# Patient Record
Sex: Female | Born: 1985 | Race: Black or African American | Hispanic: No | Marital: Single
Health system: Southern US, Community
[De-identification: ages and names within clinical notes are randomized; demographics above are authoritative.]

## PROBLEM LIST (undated history)

## (undated) DIAGNOSIS — N879 Dysplasia of cervix uteri, unspecified: Secondary | ICD-10-CM

## (undated) HISTORY — DX: Dysplasia of cervix uteri, unspecified: N87.9

---

## 2006-06-25 ENCOUNTER — Ambulatory Visit: Payer: Self-pay | Admitting: Family Medicine

## 2006-06-25 ENCOUNTER — Other Ambulatory Visit: Admission: RE | Admit: 2006-06-25 | Discharge: 2006-06-25 | Payer: Self-pay | Admitting: Family Medicine

## 2006-06-25 ENCOUNTER — Encounter (INDEPENDENT_AMBULATORY_CARE_PROVIDER_SITE_OTHER): Payer: Self-pay | Admitting: *Deleted

## 2006-07-02 ENCOUNTER — Ambulatory Visit: Payer: Self-pay | Admitting: Family Medicine

## 2006-11-19 ENCOUNTER — Ambulatory Visit: Payer: Self-pay | Admitting: Family Medicine

## 2007-01-10 ENCOUNTER — Ambulatory Visit: Payer: Self-pay | Admitting: Family Medicine

## 2007-01-10 ENCOUNTER — Other Ambulatory Visit: Admission: RE | Admit: 2007-01-10 | Discharge: 2007-01-10 | Payer: Self-pay | Admitting: Family Medicine

## 2007-01-10 ENCOUNTER — Encounter: Payer: Self-pay | Admitting: Family Medicine

## 2007-01-10 ENCOUNTER — Encounter (INDEPENDENT_AMBULATORY_CARE_PROVIDER_SITE_OTHER): Payer: Self-pay | Admitting: *Deleted

## 2007-01-11 ENCOUNTER — Encounter: Payer: Self-pay | Admitting: Family Medicine

## 2007-01-21 ENCOUNTER — Encounter: Admission: RE | Admit: 2007-01-21 | Discharge: 2007-01-21 | Payer: Self-pay | Admitting: Family Medicine

## 2007-01-21 ENCOUNTER — Ambulatory Visit: Payer: Self-pay | Admitting: Family Medicine

## 2007-03-25 ENCOUNTER — Ambulatory Visit: Payer: Self-pay | Admitting: Family Medicine

## 2007-03-31 ENCOUNTER — Ambulatory Visit: Payer: Self-pay | Admitting: Family Medicine

## 2007-03-31 DIAGNOSIS — B373 Candidiasis of vulva and vagina: Secondary | ICD-10-CM | POA: Insufficient documentation

## 2007-04-01 ENCOUNTER — Encounter: Payer: Self-pay | Admitting: Family Medicine

## 2007-04-04 ENCOUNTER — Telehealth (INDEPENDENT_AMBULATORY_CARE_PROVIDER_SITE_OTHER): Payer: Self-pay | Admitting: *Deleted

## 2007-04-04 LAB — CONVERTED CEMR LAB: GC Probe Amp, Genital: NEGATIVE

## 2007-05-19 ENCOUNTER — Telehealth (INDEPENDENT_AMBULATORY_CARE_PROVIDER_SITE_OTHER): Payer: Self-pay | Admitting: *Deleted

## 2007-05-26 ENCOUNTER — Ambulatory Visit: Payer: Self-pay | Admitting: Family Medicine

## 2007-05-26 DIAGNOSIS — N6459 Other signs and symptoms in breast: Secondary | ICD-10-CM

## 2007-05-26 DIAGNOSIS — R002 Palpitations: Secondary | ICD-10-CM | POA: Insufficient documentation

## 2007-05-27 ENCOUNTER — Ambulatory Visit: Payer: Self-pay | Admitting: Family Medicine

## 2007-06-01 LAB — CONVERTED CEMR LAB
Basophils Relative: 1.5 % — ABNORMAL HIGH (ref 0.0–1.0)
HCT: 35.2 % — ABNORMAL LOW (ref 36.0–46.0)
Hemoglobin: 12 g/dL (ref 12.0–15.0)
Lymphocytes Relative: 52.5 % — ABNORMAL HIGH (ref 12.0–46.0)
MCHC: 34 g/dL (ref 30.0–36.0)
Monocytes Absolute: 0.4 10*3/uL (ref 0.2–0.7)
Monocytes Relative: 7.7 % (ref 3.0–11.0)
Neutro Abs: 2.1 10*3/uL (ref 1.4–7.7)
Neutrophils Relative %: 36.3 % — ABNORMAL LOW (ref 43.0–77.0)
Prolactin: 25.7 ng/mL

## 2007-08-01 ENCOUNTER — Ambulatory Visit: Payer: Self-pay | Admitting: Family Medicine

## 2007-11-10 ENCOUNTER — Encounter: Payer: Self-pay | Admitting: Family Medicine

## 2007-11-10 ENCOUNTER — Other Ambulatory Visit: Admission: RE | Admit: 2007-11-10 | Discharge: 2007-11-10 | Payer: Self-pay | Admitting: Family Medicine

## 2007-11-10 ENCOUNTER — Ambulatory Visit: Payer: Self-pay | Admitting: Family Medicine

## 2007-11-10 LAB — CONVERTED CEMR LAB
Bilirubin Urine: NEGATIVE
Blood in Urine, dipstick: NEGATIVE
Protein, U semiquant: NEGATIVE
Urobilinogen, UA: NEGATIVE
pH: 7.5

## 2007-11-14 LAB — CONVERTED CEMR LAB
AST: 28 units/L (ref 0–37)
Albumin: 4.2 g/dL (ref 3.5–5.2)
Alkaline Phosphatase: 80 units/L (ref 39–117)
BUN: 8 mg/dL (ref 6–23)
Basophils Absolute: 0 10*3/uL (ref 0.0–0.1)
Chloride: 103 meq/L (ref 96–112)
Cholesterol: 171 mg/dL (ref 0–200)
Creatinine, Ser: 0.8 mg/dL (ref 0.4–1.2)
Eosinophils Absolute: 0.1 10*3/uL (ref 0.0–0.6)
GFR calc non Af Amer: 95 mL/min
HCT: 38.1 % (ref 36.0–46.0)
HDL: 63.8 mg/dL (ref >39.0)
LDL Cholesterol: 96 mg/dL (ref 0–99)
MCHC: 34.1 g/dL (ref 30.0–36.0)
MCV: 81.8 fL (ref 78.0–100.0)
Monocytes Relative: 13.7 % — ABNORMAL HIGH (ref 3.0–11.0)
Platelets: 230 10*3/uL (ref 150–400)
RBC: 4.65 M/uL (ref 3.87–5.11)
RDW: 13.5 % (ref 11.5–14.6)
Sodium: 137 meq/L (ref 135–145)
TSH: 0.76 microintl units/mL (ref 0.35–5.50)
Total Bilirubin: 0.7 mg/dL (ref 0.3–1.2)
Total CHOL/HDL Ratio: 2.7
Triglycerides: 57 mg/dL (ref 0–149)

## 2007-11-15 ENCOUNTER — Encounter (INDEPENDENT_AMBULATORY_CARE_PROVIDER_SITE_OTHER): Payer: Self-pay | Admitting: *Deleted

## 2007-11-16 ENCOUNTER — Telehealth (INDEPENDENT_AMBULATORY_CARE_PROVIDER_SITE_OTHER): Payer: Self-pay | Admitting: *Deleted

## 2007-11-16 DIAGNOSIS — R87619 Unspecified abnormal cytological findings in specimens from cervix uteri: Secondary | ICD-10-CM

## 2007-11-28 ENCOUNTER — Ambulatory Visit: Payer: Self-pay | Admitting: Family Medicine

## 2007-11-29 LAB — CONVERTED CEMR LAB
Basophils Relative: 0.1 % (ref 0.0–1.0)
Eosinophils Absolute: 0.1 10*3/uL (ref 0.0–0.6)
Eosinophils Relative: 1 % (ref 0.0–5.0)
Hemoglobin: 12.2 g/dL (ref 12.0–15.0)
Lymphocytes Relative: 39.3 % (ref 12.0–46.0)
MCV: 81.7 fL (ref 78.0–100.0)
Monocytes Absolute: 0.6 10*3/uL (ref 0.2–0.7)
Monocytes Relative: 8 % (ref 3.0–11.0)
Neutro Abs: 3.9 10*3/uL (ref 1.4–7.7)
Platelets: 230 10*3/uL (ref 150–400)
WBC: 7.5 10*3/uL (ref 4.5–10.5)

## 2008-01-18 HISTORY — PX: LASER ABLATION OF THE CERVIX: SHX1949

## 2008-01-30 ENCOUNTER — Ambulatory Visit (HOSPITAL_BASED_OUTPATIENT_CLINIC_OR_DEPARTMENT_OTHER): Admission: RE | Admit: 2008-01-30 | Discharge: 2008-01-30 | Payer: Self-pay | Admitting: Gynecology

## 2008-04-01 IMAGING — US US PELVIS COMPLETE MODIFY
1 series · 14 of 25 positions shown · non-contrast
Comparison: None.

CLINICAL DATA: Vaginal bleeding.
TRANSABDOMINAL AND TRANSVAGINAL PELVIC ULTRASOUND:
TECHNIQUE: Both transabdominal and transvaginal ultrasound examinations of the pelvis were performed, including evaluation of the uterus, ovaries, adnexal regions, and pelvic cul-de-sac.

[Series 1: unknown · 0.25mm/px · 14 of 58 slices shown]
[im 1/58]
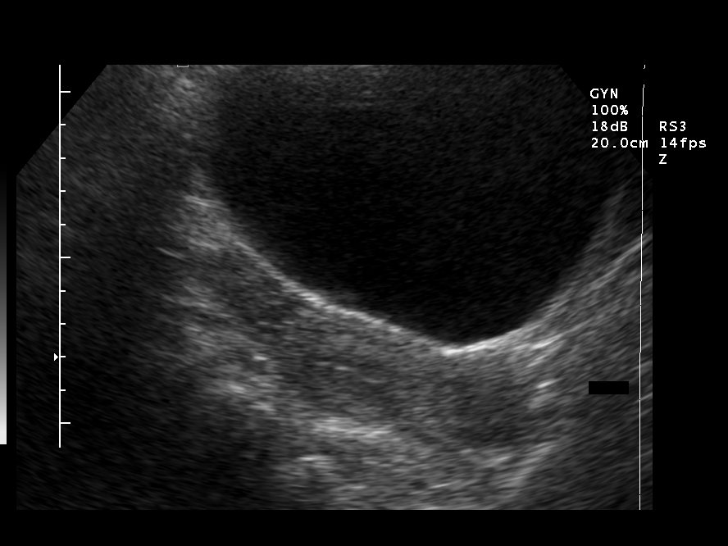
[im 5/58]
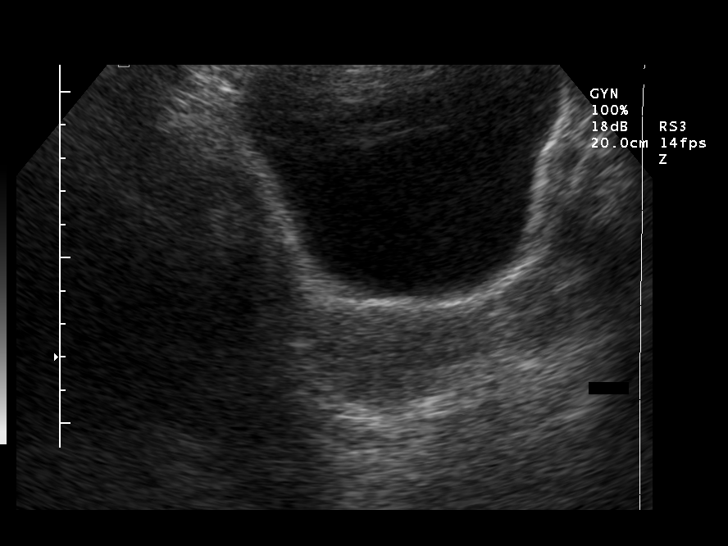
[im 10/58]
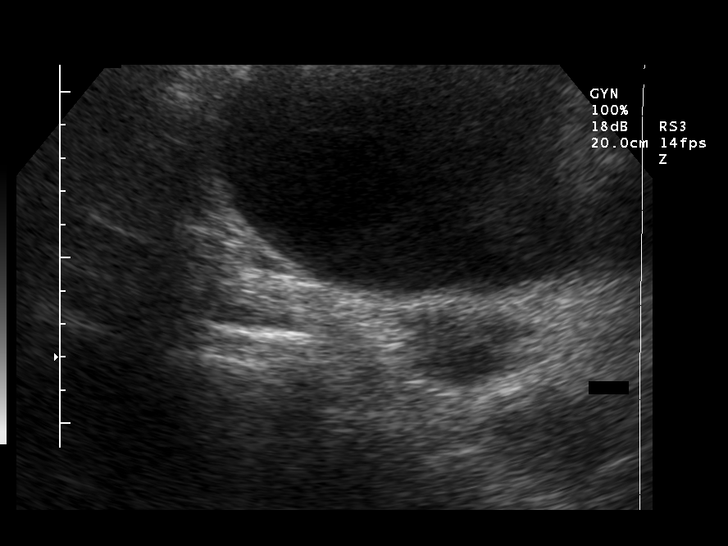
[im 15/58]
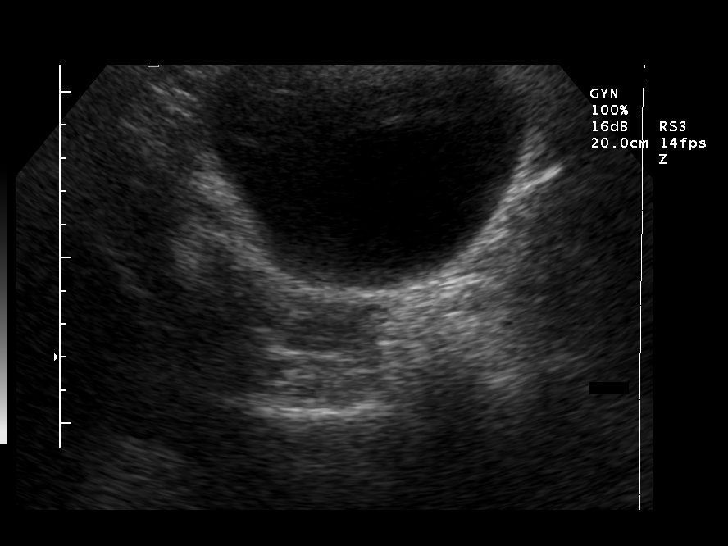
[im 20/58]
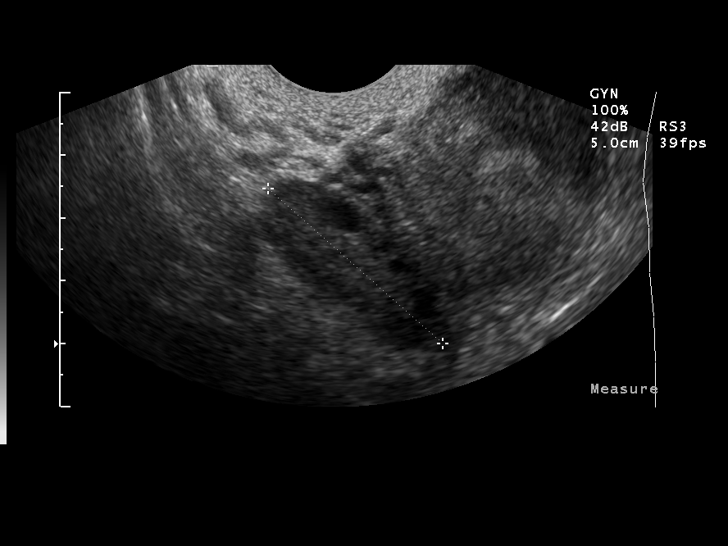
[im 22/58]
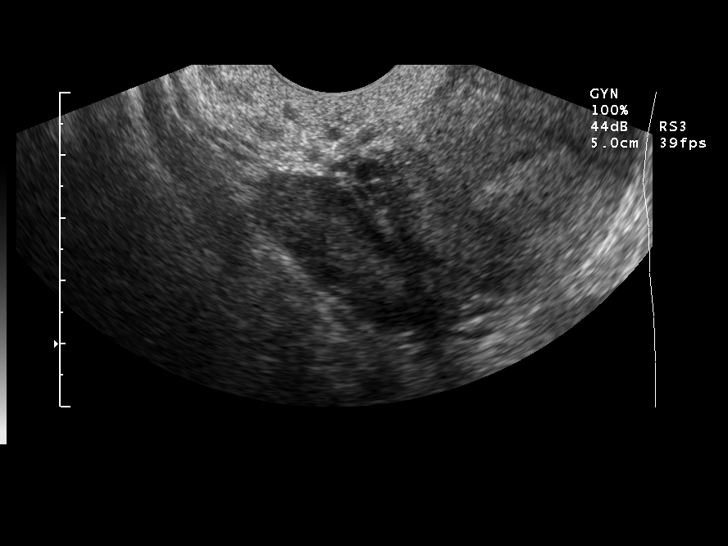
[im 27/58]
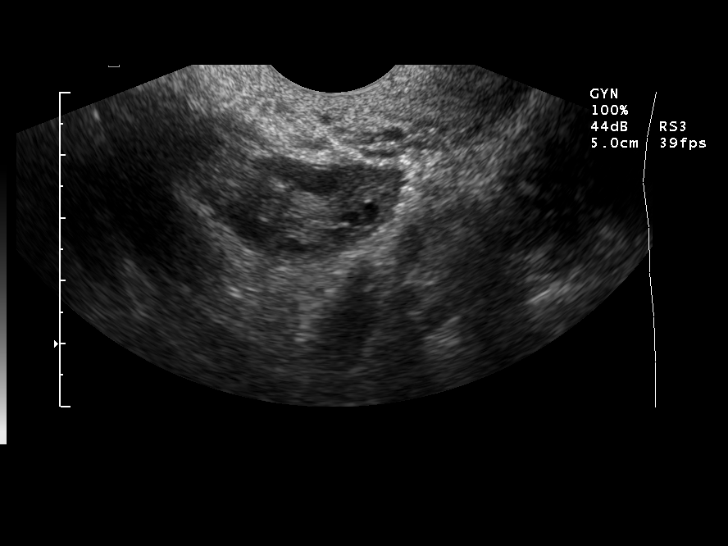
[im 31/58]
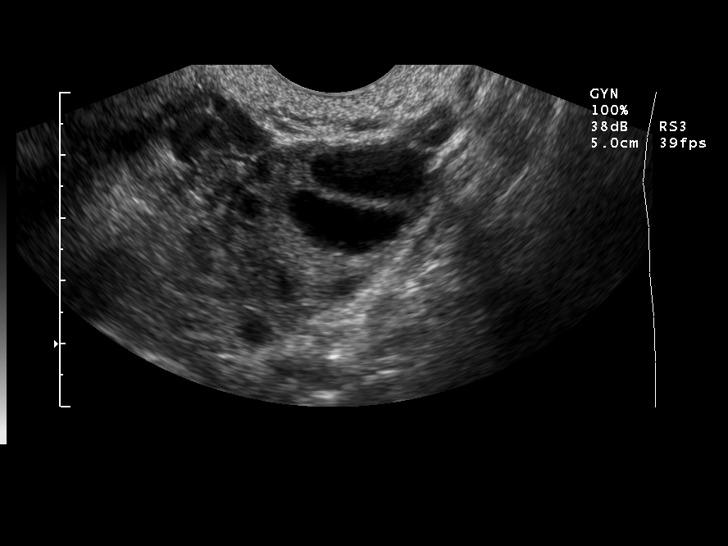
[im 36/58]
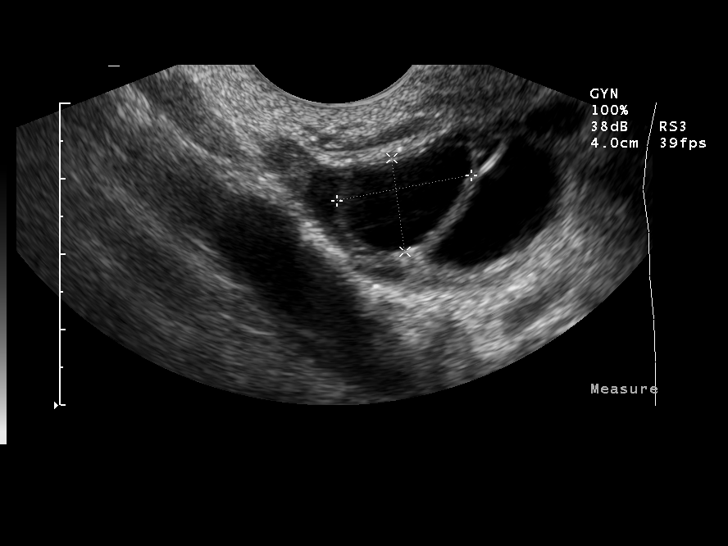
[im 39/58]
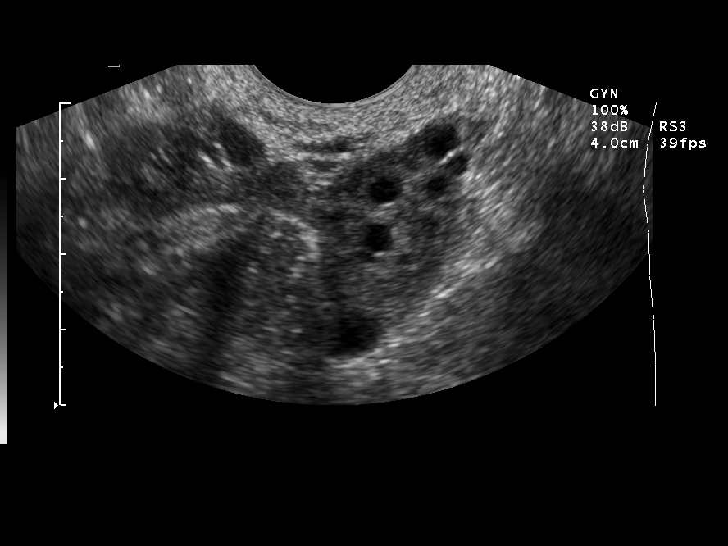
[im 43/58]
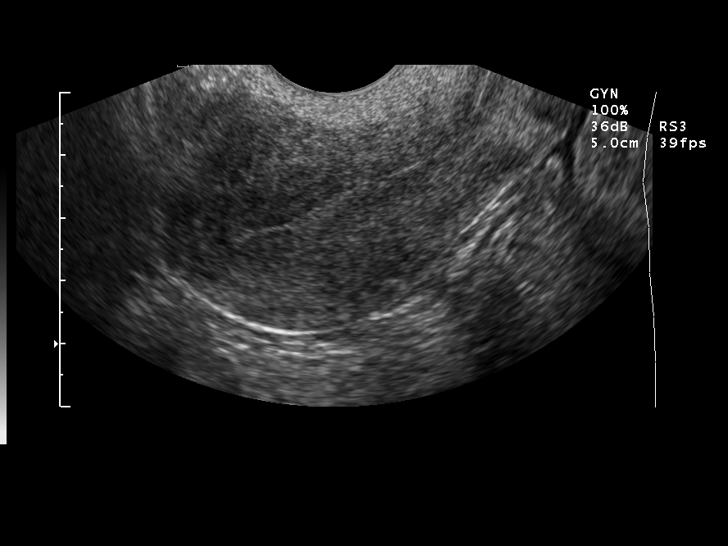
[im 48/58]
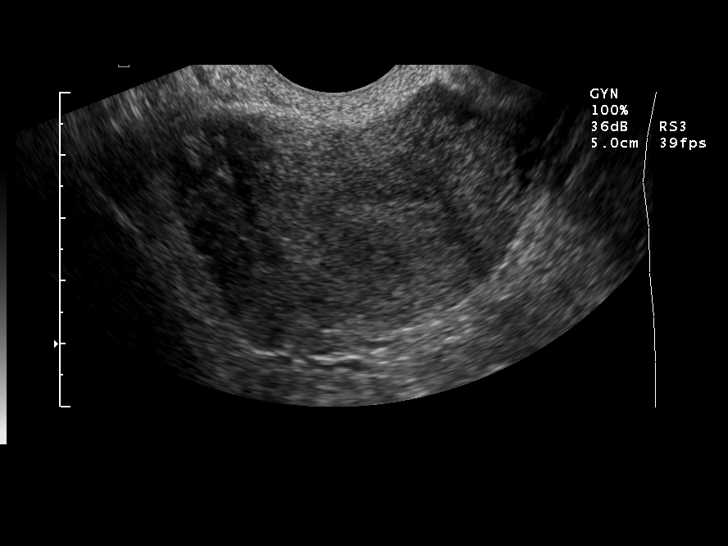
[im 53/58]
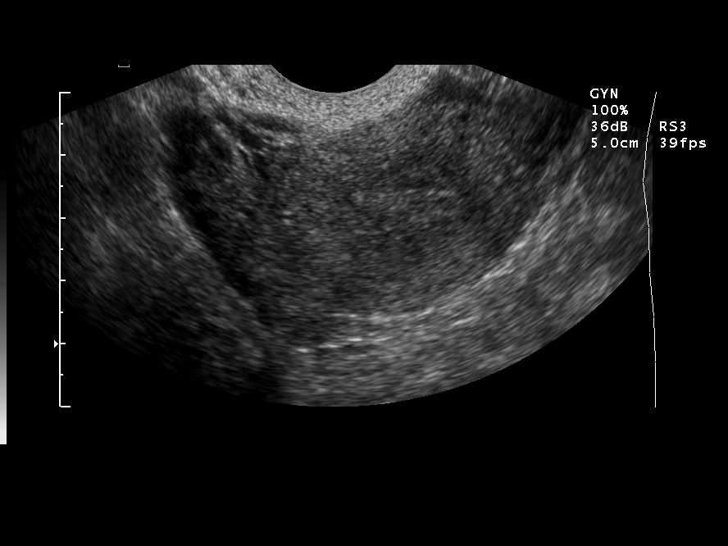
[im 58/58]
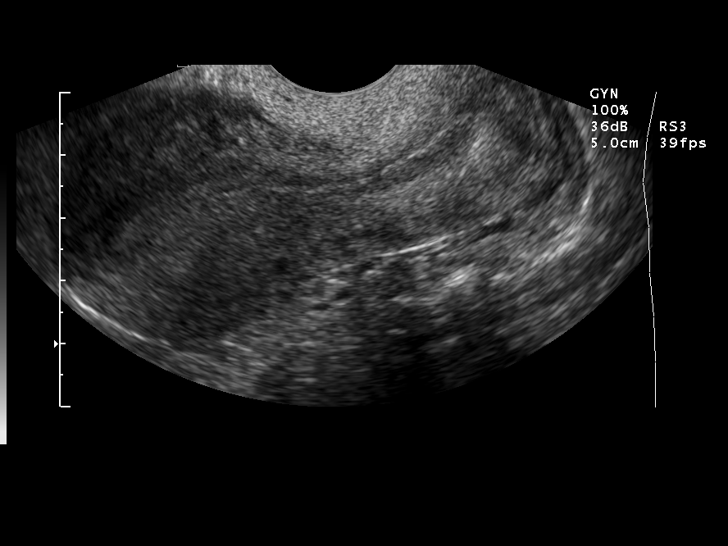

[14 of 25 positions shown; findings below may reference images not displayed]

Uterus is sonographically normal measuring 7.7 cm long by 3.1 cm AP by 5 cm wide with 9 mm fundal endometrial stripe thickness.  Bilateral ovaries are normal in size with the right measuring 3.3 cm long by 1.9 cm AP by 3.7 cm wide and the left 3.5 cm long by 2.9 cm AP by 3.8 cm wi[REDACTED] subcentimeter right ovarian follicles are noted.  The left ovary demonstrates either two adjacent cysts or one septated cyst in entirety measuring 2.8 cm long by 1.6 cm AP by 1.9 cm wide.  Trace cul-de-sac free fluid seen transvaginally only is likely physiologic, such as ovulation. No other significant free fluid noted.
IMPRESSION: 1.  Two adjacent versus one septated cyst measuring up to 2.8 cm, left ovary.
2.  Probable physiologic ovulatory free fluid transvaginally only.
3.  Otherwise normal.

## 2008-10-23 ENCOUNTER — Ambulatory Visit: Payer: Self-pay | Admitting: Family Medicine

## 2008-10-23 LAB — CONVERTED CEMR LAB
Protein, U semiquant: NEGATIVE
Urobilinogen, UA: NEGATIVE
WBC Urine, dipstick: NEGATIVE

## 2008-10-24 ENCOUNTER — Encounter: Payer: Self-pay | Admitting: Family Medicine

## 2008-10-25 LAB — CONVERTED CEMR LAB

## 2008-10-26 ENCOUNTER — Encounter (INDEPENDENT_AMBULATORY_CARE_PROVIDER_SITE_OTHER): Payer: Self-pay | Admitting: *Deleted

## 2008-11-08 LAB — CONVERTED CEMR LAB
ALT: 13 units/L (ref 0–35)
AST: 21 units/L (ref 0–37)
Basophils Absolute: 0 10*3/uL (ref 0.0–0.1)
Basophils Relative: 0.3 % (ref 0.0–3.0)
CO2: 28 meq/L (ref 19–32)
Chloride: 104 meq/L (ref 96–112)
Creatinine, Ser: 0.7 mg/dL (ref 0.4–1.2)
Direct LDL: 143.6 mg/dL
Eosinophils Relative: 2.3 % (ref 0.0–5.0)
Glucose, Bld: 86 mg/dL (ref 70–99)
HDL: 71.6 mg/dL (ref >39.0)
Hemoglobin: 14.4 g/dL (ref 12.0–15.0)
Lymphocytes Relative: 52.8 % — ABNORMAL HIGH (ref 12.0–46.0)
Monocytes Relative: 7.9 % (ref 3.0–12.0)
Neutrophils Relative %: 36.7 % — ABNORMAL LOW (ref 43.0–77.0)
RBC: 5 M/uL (ref 3.87–5.11)
Total Bilirubin: 0.9 mg/dL (ref 0.3–1.2)
Total CHOL/HDL Ratio: 3.2
Total Protein: 8 g/dL (ref 6.0–8.3)
VLDL: 17 mg/dL (ref 0–40)
WBC: 4.9 10*3/uL (ref 4.5–10.5)

## 2008-11-09 ENCOUNTER — Encounter (INDEPENDENT_AMBULATORY_CARE_PROVIDER_SITE_OTHER): Payer: Self-pay | Admitting: *Deleted

## 2008-11-12 ENCOUNTER — Telehealth (INDEPENDENT_AMBULATORY_CARE_PROVIDER_SITE_OTHER): Payer: Self-pay | Admitting: *Deleted

## 2008-11-12 ENCOUNTER — Encounter (INDEPENDENT_AMBULATORY_CARE_PROVIDER_SITE_OTHER): Payer: Self-pay | Admitting: *Deleted

## 2008-11-13 ENCOUNTER — Ambulatory Visit: Payer: Self-pay | Admitting: Family Medicine

## 2008-11-19 ENCOUNTER — Telehealth (INDEPENDENT_AMBULATORY_CARE_PROVIDER_SITE_OTHER): Payer: Self-pay | Admitting: *Deleted

## 2008-11-19 DIAGNOSIS — Z862 Personal history of diseases of the blood and blood-forming organs and certain disorders involving the immune mechanism: Secondary | ICD-10-CM

## 2008-11-19 DIAGNOSIS — Z8639 Personal history of other endocrine, nutritional and metabolic disease: Secondary | ICD-10-CM

## 2008-11-19 LAB — CONVERTED CEMR LAB
Alkaline Phosphatase: 193 units/L — ABNORMAL HIGH (ref 39–117)
Bilirubin, Direct: 0.1 mg/dL (ref 0.0–0.3)
Total Protein: 8 g/dL (ref 6.0–8.3)

## 2008-12-05 ENCOUNTER — Telehealth (INDEPENDENT_AMBULATORY_CARE_PROVIDER_SITE_OTHER): Payer: Self-pay | Admitting: *Deleted

## 2008-12-14 ENCOUNTER — Encounter: Admission: RE | Admit: 2008-12-14 | Discharge: 2008-12-14 | Payer: Self-pay | Admitting: Family Medicine

## 2008-12-18 ENCOUNTER — Telehealth (INDEPENDENT_AMBULATORY_CARE_PROVIDER_SITE_OTHER): Payer: Self-pay | Admitting: *Deleted

## 2008-12-18 DIAGNOSIS — E663 Overweight: Secondary | ICD-10-CM | POA: Insufficient documentation

## 2008-12-21 ENCOUNTER — Ambulatory Visit: Payer: Self-pay | Admitting: Family Medicine

## 2008-12-21 DIAGNOSIS — F438 Other reactions to severe stress: Secondary | ICD-10-CM

## 2008-12-21 DIAGNOSIS — F4389 Other reactions to severe stress: Secondary | ICD-10-CM | POA: Insufficient documentation

## 2008-12-21 DIAGNOSIS — R5381 Other malaise: Secondary | ICD-10-CM

## 2008-12-21 DIAGNOSIS — R5383 Other fatigue: Secondary | ICD-10-CM

## 2008-12-24 ENCOUNTER — Encounter (INDEPENDENT_AMBULATORY_CARE_PROVIDER_SITE_OTHER): Payer: Self-pay | Admitting: *Deleted

## 2008-12-24 LAB — CONVERTED CEMR LAB
BUN: 9 mg/dL (ref 6–23)
Basophils Absolute: 0 10*3/uL (ref 0.0–0.1)
Creatinine, Ser: 0.7 mg/dL (ref 0.4–1.2)
GFR calc Af Amer: 133 mL/min
GFR calc non Af Amer: 110 mL/min
HCT: 38 % (ref 36.0–46.0)
Hemoglobin: 12.9 g/dL (ref 12.0–15.0)
MCHC: 34 g/dL (ref 30.0–36.0)
Monocytes Absolute: 0.4 10*3/uL (ref 0.1–1.0)
Neutro Abs: 2.5 10*3/uL (ref 1.4–7.7)
RDW: 12.1 % (ref 11.5–14.6)
Vitamin B-12: 675 pg/mL (ref 211–911)

## 2009-01-04 ENCOUNTER — Ambulatory Visit: Payer: Self-pay | Admitting: *Deleted

## 2009-01-16 ENCOUNTER — Ambulatory Visit: Payer: Self-pay | Admitting: *Deleted

## 2009-01-25 ENCOUNTER — Encounter: Payer: Self-pay | Admitting: Family Medicine

## 2009-02-01 ENCOUNTER — Ambulatory Visit: Payer: Self-pay | Admitting: *Deleted

## 2009-02-08 ENCOUNTER — Ambulatory Visit: Payer: Self-pay | Admitting: *Deleted

## 2009-02-15 ENCOUNTER — Ambulatory Visit: Payer: Self-pay | Admitting: *Deleted

## 2009-02-22 ENCOUNTER — Ambulatory Visit: Payer: Self-pay | Admitting: *Deleted

## 2009-03-08 ENCOUNTER — Ambulatory Visit: Payer: Self-pay | Admitting: *Deleted

## 2009-03-15 ENCOUNTER — Ambulatory Visit: Payer: Self-pay | Admitting: *Deleted

## 2009-03-22 ENCOUNTER — Ambulatory Visit: Payer: Self-pay | Admitting: *Deleted

## 2009-04-19 ENCOUNTER — Ambulatory Visit: Payer: Self-pay | Admitting: Gynecology

## 2009-04-19 ENCOUNTER — Other Ambulatory Visit: Admission: RE | Admit: 2009-04-19 | Discharge: 2009-04-19 | Payer: Self-pay | Admitting: Gynecology

## 2009-04-19 ENCOUNTER — Encounter: Payer: Self-pay | Admitting: Gynecology

## 2009-06-17 ENCOUNTER — Ambulatory Visit: Payer: Self-pay | Admitting: Women's Health

## 2009-11-04 ENCOUNTER — Other Ambulatory Visit: Admission: RE | Admit: 2009-11-04 | Discharge: 2009-11-04 | Payer: Self-pay | Admitting: Gynecology

## 2009-11-04 ENCOUNTER — Ambulatory Visit: Payer: Self-pay | Admitting: Gynecology

## 2009-11-25 ENCOUNTER — Encounter: Payer: Self-pay | Admitting: Orthopedic Surgery

## 2009-11-25 ENCOUNTER — Ambulatory Visit: Payer: Self-pay | Admitting: Infectious Diseases

## 2009-11-25 LAB — CONVERTED CEMR LAB
Basophils Absolute: 0 10*3/uL (ref 0.0–0.1)
Basophils Relative: 1 % (ref 0–1)
Calcium: 10.1 mg/dL (ref 8.4–10.5)
Glucose, Bld: 79 mg/dL (ref 70–99)
HIV 1 RNA Quant: <48 copies/mL (ref <48)
HIV-1 RNA Quant, Log: <1.68 (ref <1.68)
MCHC: 34.6 g/dL (ref 30.0–36.0)
Monocytes Absolute: 0.5 10*3/uL (ref 0.1–1.0)
Neutro Abs: 2.3 10*3/uL (ref 1.7–7.7)
Neutrophils Relative %: 36 % — ABNORMAL LOW (ref 43–77)
RDW: 13.3 % (ref 11.5–15.5)
Sodium: 137 meq/L (ref 135–145)

## 2009-12-09 ENCOUNTER — Ambulatory Visit: Payer: Self-pay | Admitting: Infectious Diseases

## 2009-12-12 ENCOUNTER — Ambulatory Visit: Payer: Self-pay | Admitting: Family Medicine

## 2009-12-12 DIAGNOSIS — H538 Other visual disturbances: Secondary | ICD-10-CM

## 2010-02-23 IMAGING — US US ABDOMEN COMPLETE
1 series · 14 of 25 positions shown · non-contrast
Comparison: None

CLINICAL DATA: Elevated liver function tests

ABDOMEN ULTRASOUND
TECHNIQUE: Complete abdominal ultrasound examination was performed
including evaluation of the liver, gallbladder, bile ducts,
pancreas, kidneys, spleen, IVC, and abdominal aorta.

[Series 1: us abdomen complete · 0.26mm/px · 14 of 73 slices shown]
[im 1/73]
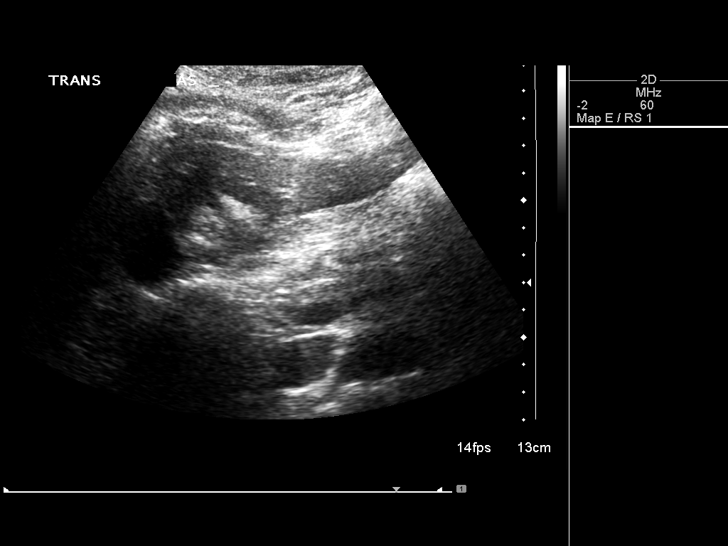
[im 7/73]
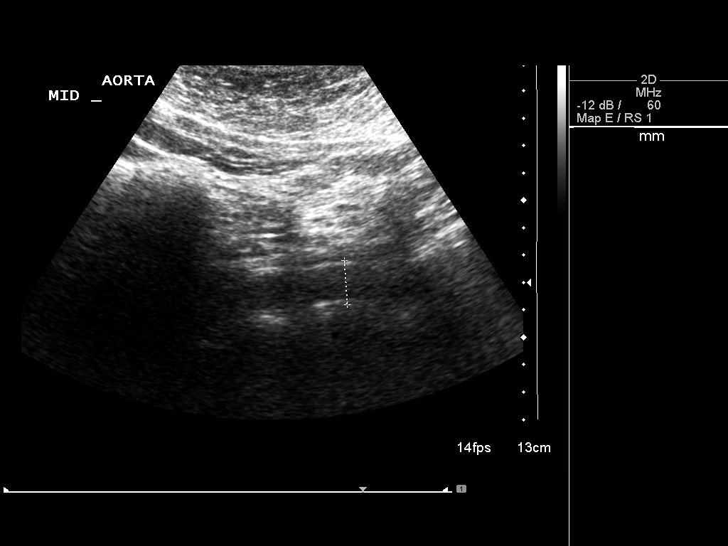
[im 13/73]
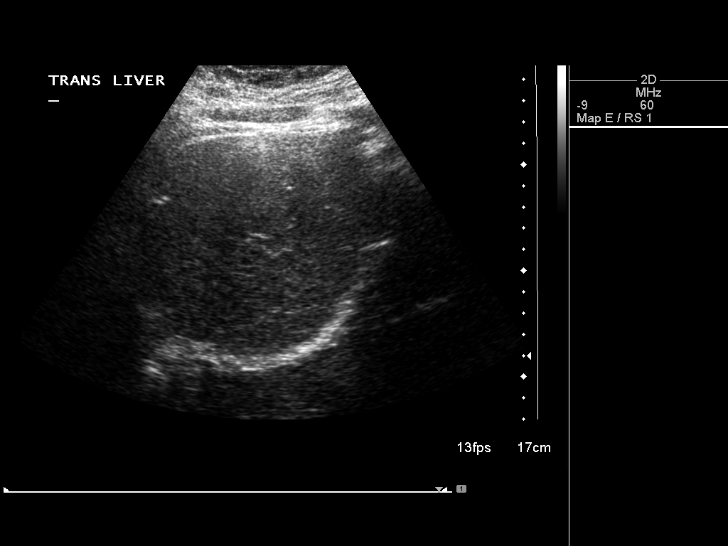
[im 19/73]
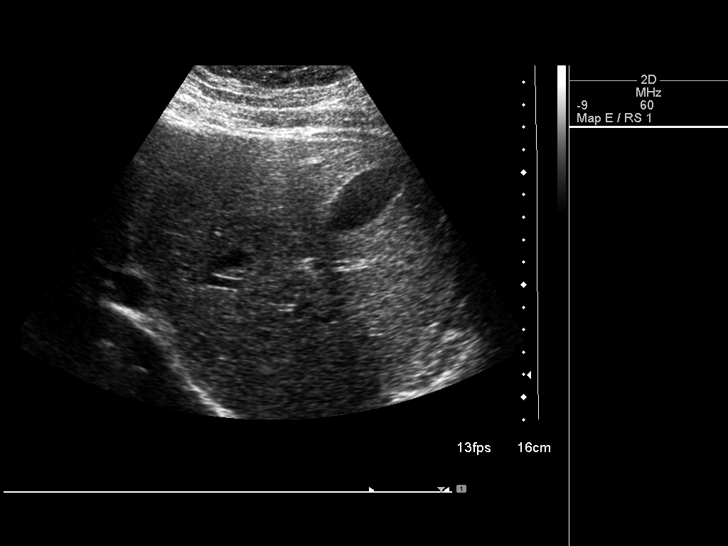
[im 25/73]
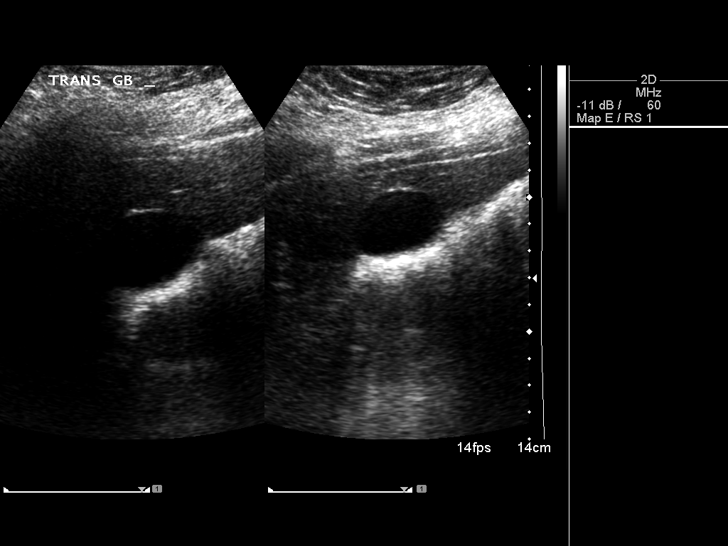
[im 28/73]
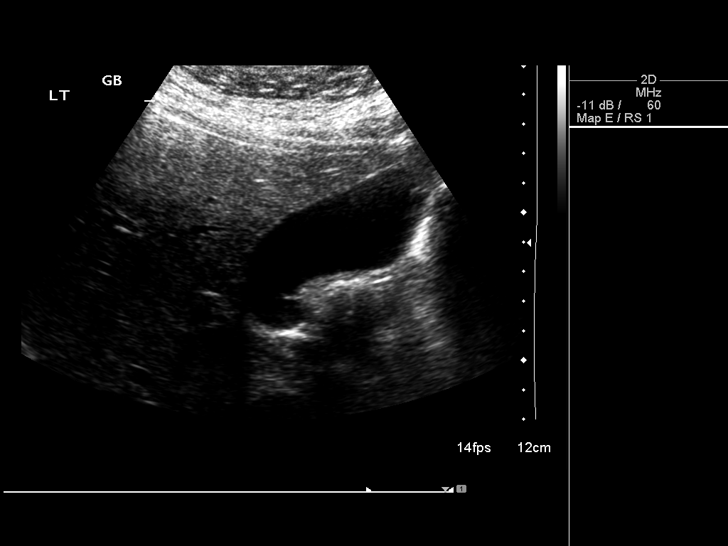
[im 34/73]
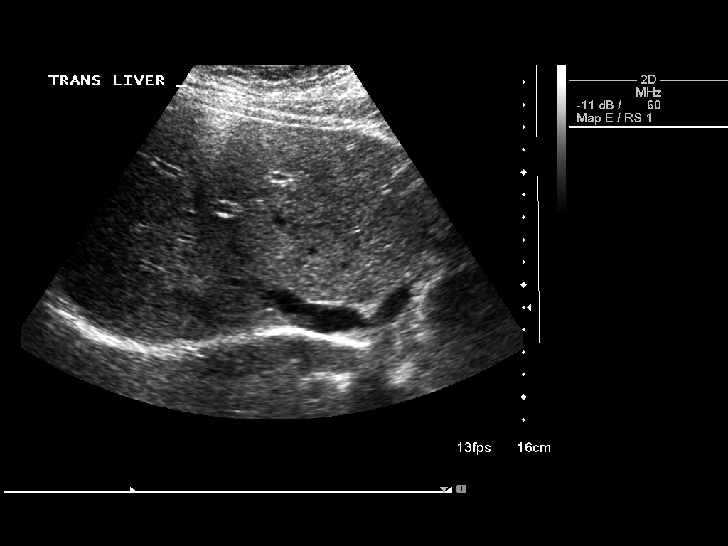
[im 40/73]
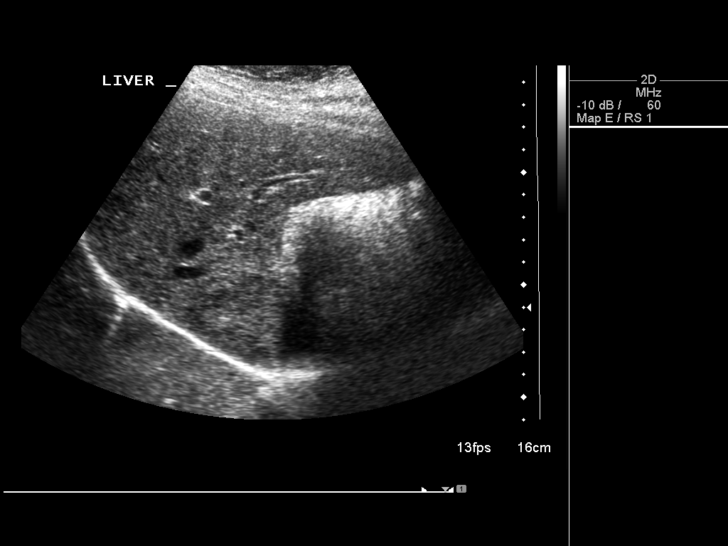
[im 46/73]
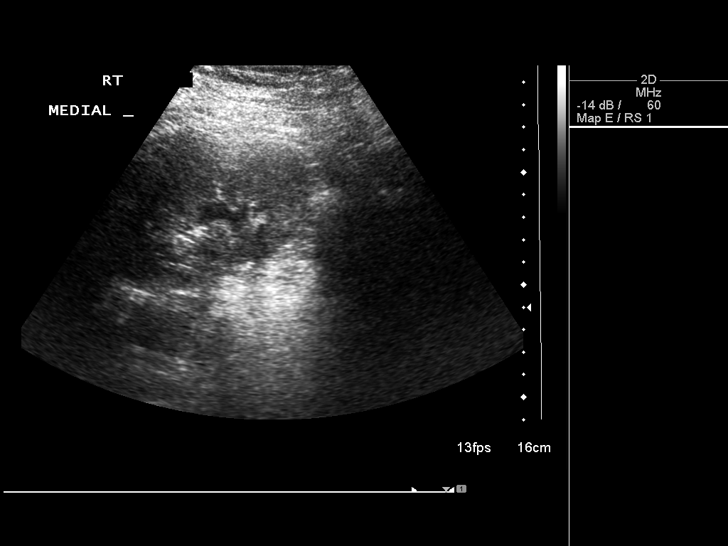
[im 49/73]
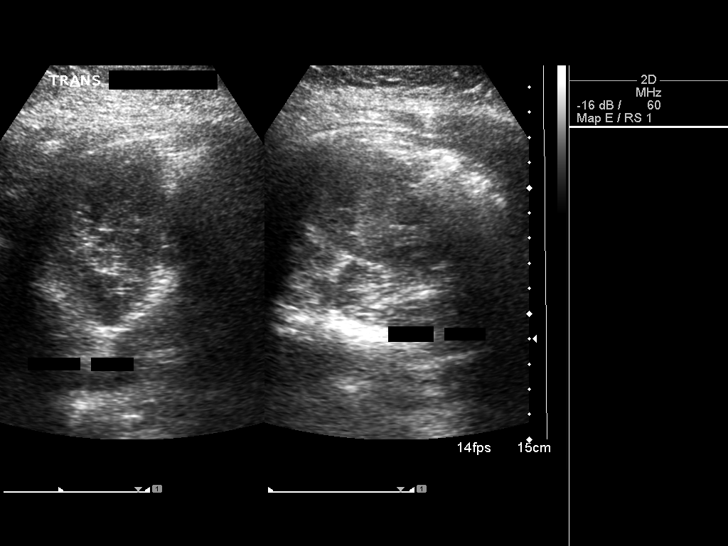
[im 55/73]
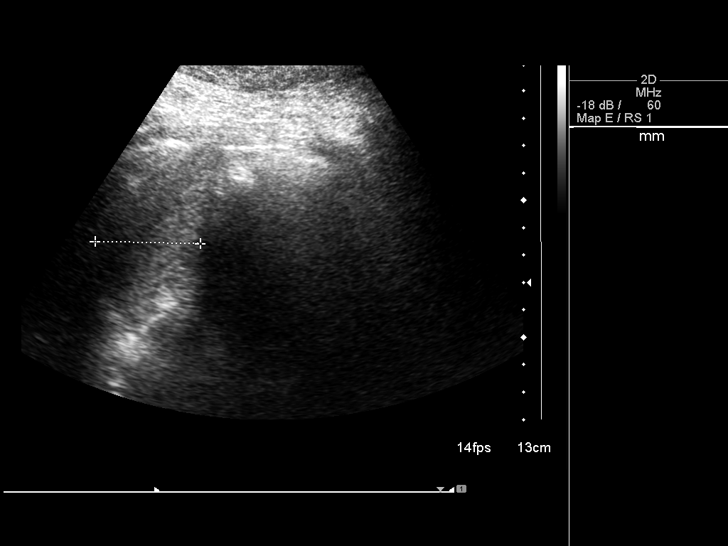
[im 61/73]
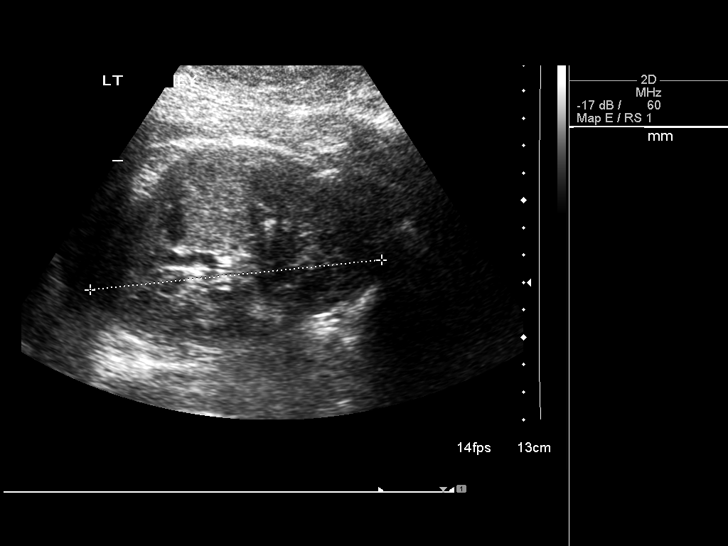
[im 67/73]
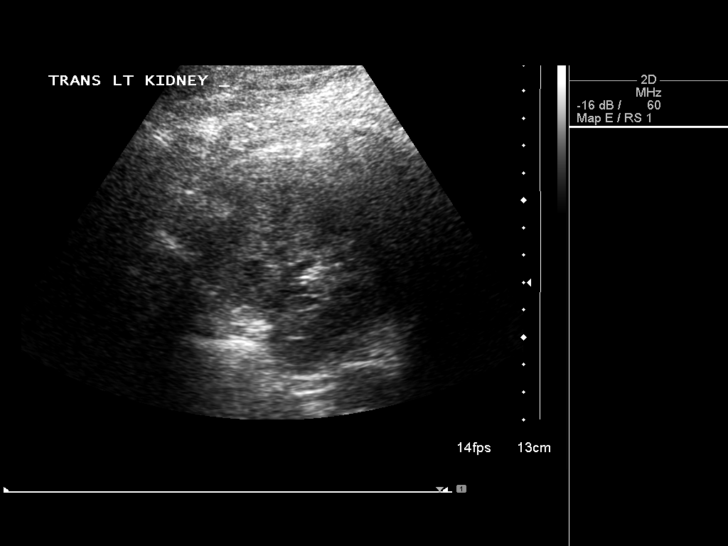
[im 73/73]
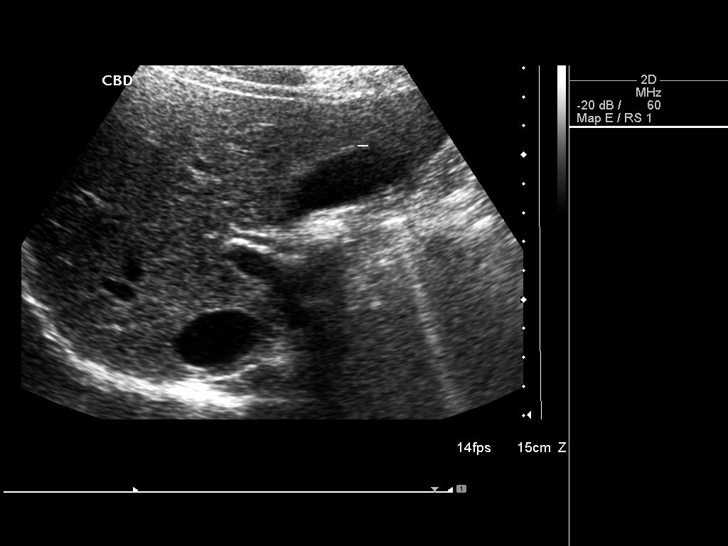

[14 of 25 positions shown; findings below may reference images not displayed]

FINDINGS: The gallbladder is well seen and no gallstones are noted.
The liver is slightly echogenic suggesting mild fatty infiltration.
No focal abnormality is seen.  The common bile duct is normal
measuring 3.6 mm in diameter.  The IVC and pancreas are not well
seen due to overlying bowel gas.  The spleen is normal in size.  No
hydronephrosis is seen.  The right kidney measures 10.8 cm
sagittally, with left kidney measuring 10.7 cm.  The abdominal
aorta is normal in caliber.
IMPRESSION: 1.  No gallstones.  Slightly echogenic liver suggests mild fatty
infiltration.
 2.  Pancreas is obscured by bowel gas.

## 2010-05-12 ENCOUNTER — Other Ambulatory Visit: Admission: RE | Admit: 2010-05-12 | Discharge: 2010-05-12 | Payer: Self-pay | Admitting: Gynecology

## 2010-05-12 ENCOUNTER — Ambulatory Visit: Payer: Self-pay | Admitting: Gynecology

## 2010-11-18 NOTE — Assessment & Plan Note (Signed)
Summary: CPX///SPH   Vital Signs:  Patient profile:   25 year old female Height:      65.5 inches Weight:      207 pounds Temp:     98.2 degrees F oral Pulse rate:   90 / minute Pulse rhythm:   regular BP sitting:   124 / 80  (left arm) Cuff size:   regular  Vitals Entered By: Army Fossa CMA (December 12, 2009 2:57 PM) CC: CPX, no pap   History of Present Illness: Pt here for cpe.  No pap ---pt is not fasting.    Preventive Screening-Counseling & Management  Alcohol-Tobacco     Alcohol drinks/day: <1     Alcohol type: mixed drinks     Smoking Status: never  Caffeine-Diet-Exercise     Caffeine use/day: 0     Does Patient Exercise: yes     Type of exercise: gym, aerobics     Exercise (avg: min/session): 30-60     Times/week: <3     Exercise Counseling: to improve exercise regimen  Hep-HIV-STD-Contraception     HIV Risk: yes     Dental Visit-last 6 months yes     Dental Care Counseling: not indicated; dental care within six months  Safety-Violence-Falls     Seat Belt Use: yes      Sexual History:  single.    Current Medications (verified): 1)  Alesse .... Use As Directred 2)  Doxycycline Hyclate 100 Mg Solr (Doxycycline Hyclate) .... Take 1 Tablet By Mouth Once A Day 3)  Phentermine Hcl  Powd (Phentermine Hcl) 4)  Boric Acid  Gran (Boric Acid) .Marland Kitchen.. 1 Cap Pv 3x A Week  Allergies: 1)  ! * Seafood  Past History:  Past Medical History: Last updated: 11/25/2009 Unremarkable Current Problems:  PAP SMEAR, ABNORMAL (ICD-795.00) CIN 2008 ROUTINE GYNECOLOGICAL EXAMINATION (ICD-V72.31) PREVENTIVE HEALTH CARE (ICD-V70.0) FAMILY HISTORY OF CERVICAL CANCER (ICD-V17.3) PALPITATIONS, OCCASIONAL (ICD-785.1) NIPPLE DISCHARGE (ICD-611.79) CANDIDIASIS, VAGINAL (ICD-112.1) FAMILY HISTORY DIABETES 1ST DEGREE RELATIVE (ICD-V18.0)  Past Surgical History: Last updated: 10/23/2008 bx cervix--4/09  Family History: Last updated: 12/12/2009 Family History of  Arthritis Family History Diabetes 1st degree relative Family History of Cervical cancer-- Mom MGreat aunt---- breast Cancer Family History Kidney disease Family History of Colon CA 1st degree relative in 90s   Social History: Last updated: 11/25/2009 Single Never Smoked Alcohol use-yes-rare Drug use-no Regular exercise-yes Occupation: [employer]   Risk Factors: Alcohol Use: <1 (12/12/2009) Caffeine Use: 0 (12/12/2009) Exercise: yes (12/12/2009)  Risk Factors: Smoking Status: never (12/12/2009)  Family History: Reviewed history from 08/01/2007 and no changes required. Family History of Arthritis Family History Diabetes 1st degree relative Family History of Cervical cancer-- Mom MGreat aunt---- breast Cancer Family History Kidney disease Family History of Colon CA 1st degree relative in 7s   Social History: Reviewed history from 11/25/2009 and no changes required. Single Never Smoked Alcohol use-yes-rare Drug use-no Regular exercise-yes Occupation: [employer]  Dental Care w/in 6 mos.:  yes Sexual History:  single  Review of Systems      See HPI General:  Denies chills, fatigue, fever, loss of appetite, malaise, sleep disorder, sweats, weakness, and weight loss. Eyes:  Denies discharge, double vision, eye irritation, eye pain, halos, itching, light sensitivity, red eye, vision loss-1 eye, and vision loss-both eyes. ENT:  Denies decreased hearing, difficulty swallowing, ear discharge, earache, hoarseness, nasal congestion, nosebleeds, postnasal drainage, ringing in ears, sinus pressure, and sore throat. CV:  Denies bluish  discoloration of lips or nails, chest pain or discomfort, difficulty breathing at night, difficulty breathing while lying down, fainting, fatigue, leg cramps with exertion, lightheadness, near fainting, palpitations, shortness of breath with exertion, swelling of feet, swelling of hands, and weight gain. Resp:   Denies chest discomfort, chest pain with inspiration, cough, coughing up blood, excessive snoring, hypersomnolence, morning headaches, pleuritic, shortness of breath, sputum productive, and wheezing. GI:  Denies abdominal pain, bloody stools, change in bowel habits, constipation, dark tarry stools, diarrhea, excessive appetite, gas, hemorrhoids, indigestion, loss of appetite, nausea, vomiting, vomiting blood, and yellowish skin color. GU:  Denies abnormal vaginal bleeding, decreased libido, discharge, dysuria, genital sores, hematuria, incontinence, nocturia, urinary frequency, and urinary hesitancy. MS:  Denies joint pain, joint redness, joint swelling, loss of strength, low back pain, mid back pain, muscle aches, muscle , cramps, muscle weakness, stiffness, and thoracic pain. Derm:  Denies changes in color of skin, changes in nail beds, dryness, excessive perspiration, flushing, hair loss, insect bite(s), itching, lesion(s), poor wound healing, and rash. Neuro:  Denies brief paralysis, difficulty with concentration, disturbances in coordination, falling down, headaches, inability to speak, memory loss, numbness, poor balance, seizures, sensation of room spinning, tingling, tremors, visual disturbances, and weakness. Psych:  Denies alternate hallucination ( auditory/visual), anxiety, depression, easily angered, easily tearful, irritability, mental problems, panic attacks, sense of great danger, suicidal thoughts/plans, thoughts of violence, unusual visions or sounds, and thoughts /plans of harming others. Endo:  Denies cold intolerance, excessive hunger, excessive thirst, excessive urination, heat intolerance, polyuria, and weight change. Heme:  Denies abnormal bruising, bleeding, enlarge lymph nodes, fevers, pallor, and skin discoloration. Allergy:  Denies hives or rash, itching eyes, persistent infections, seasonal allergies, and sneezing.  Physical Exam  General:  Well-developed,well-nourished,in no  acute distress; alert,appropriate and cooperative throughout examination Head:  Normocephalic and atraumatic without obvious abnormalities. No apparent alopecia or balding. Eyes:  vision grossly intact, pupils equal, pupils round, pupils reactive to light, and no injection.   Ears:  External ear exam shows no significant lesions or deformities.  Otoscopic examination reveals clear canals, tympanic membranes are intact bilaterally without bulging, retraction, inflammation or discharge. Hearing is grossly normal bilaterally. Nose:  External nasal examination shows no deformity or inflammation. Nasal mucosa are pink and moist without lesions or exudates. Mouth:  Oral mucosa and oropharynx without lesions or exudates.  Teeth in good repair. Neck:  No deformities, masses, or tenderness noted. Chest Wall:  No deformities, masses, or tenderness noted. Breasts:  gyn Lungs:  Normal respiratory effort, chest expands symmetrically. Lungs are clear to auscultation, no crackles or wheezes. Heart:  normal rate and no murmur.   Abdomen:  Bowel sounds positive,abdomen soft and non-tender without masses, organomegaly or hernias noted. Genitalia:  gyn Msk:  normal ROM, no joint tenderness, no joint swelling, no joint warmth, no redness over joints, no joint deformities, no joint instability, and no crepitation.   Pulses:  R posterior tibial normal, R dorsalis pedis normal, R carotid normal, L posterior tibial normal, L dorsalis pedis normal, and L carotid normal.   Extremities:  No clubbing, cyanosis, edema, or deformity noted with normal full range of motion of all joints.   Neurologic:  No cranial nerve deficits noted. Station and gait are normal. Plantar reflexes are down-going bilaterally. DTRs are symmetrical throughout. Sensory, motor and coordinative functions appear intact. Skin:  Intact without suspicious lesions or rashes Cervical Nodes:  No lymphadenopathy noted Axillary Nodes:  No palpable  lymphadenopathy Psych:  Cognition and judgment appear  intact. Alert and cooperative with normal attention span and concentration. No apparent delusions, illusions, hallucinations   Impression & Recommendations:  Problem # 1:  PREVENTIVE HEALTH CARE (ICD-V70.0) check fasting labs ghm utd  Problem # 2:  BLURRED VISION (ICD-368.8)  Orders: Ophthalmology Referral (Ophthalmology)  Problem # 3:  OVERWEIGHT (ICD-278.02) encouraged pt to get exercise and watch diet  Complete Medication List: 1)  Alesse  .... Use as directred 2)  Doxycycline Hyclate 100 Mg Solr (Doxycycline hyclate) .... Take 1 tablet by mouth once a day 3)  Phentermine Hcl Powd (Phentermine hcl) 4)  Boric Acid Gran (Boric acid) .Marland Kitchen.. 1 cap pv 3x a week  Other Orders: Tdap => 57yrs IM (16109) Admin 1st Vaccine (60454)  Patient Instructions: 1)  V70 .0  cbcd, bmp, hep, lipid, Tsh,   Flu Vaccine Next Due:  Refused    Immunizations Administered:  Tetanus Vaccine:    Vaccine Type: Tdap    Site: left deltoid    Mfr: GlaxoSmithKline    Dose: 0.5 ml    Route: IM    Given by: Army Fossa CMA    Exp. Date: 05/04/2011    Lot #: [id number]

## 2010-11-18 NOTE — Assessment & Plan Note (Signed)
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  History of Present Illness: 25 yo F with recurrent bacterial and yeast infections for the last 7 years. Has been increasing in freq over the last 5 yrs. Gets after sex, sometimes spontaneously. has tried changing soap, changing cotton underwear, wiping pattern. Using boric acid therapy now.  Has treated previously with metrogel, monostat, diflucan, but never resolves completely. No rashes, no sores, no ulcers, no dysuria. no change with BM (has baseline constipation qweek).   Preventive Screening-Counseling & Management  Alcohol-Tobacco     Alcohol drinks/day: <1     Alcohol type: mixed drinks     Smoking Status: never  Caffeine-Diet-Exercise     Caffeine use/day: 0     Does Patient Exercise: yes     Type of exercise: gym, aerobics     Exercise (avg: min/session): 30-60     Times/week: <3  Safety-Violence-Falls     Seat Belt Use: yes   Current Allergies (reviewed today): ! * SEAFOOD Past History:  Past Medical History: Unremarkable Current Problems:  PAP SMEAR, ABNORMAL (ICD-795.00) CIN 2008 ROUTINE GYNECOLOGICAL EXAMINATION (ICD-V72.31) PREVENTIVE HEALTH CARE (ICD-V70.0) FAMILY HISTORY OF CERVICAL CANCER (ICD-V17.3) PALPITATIONS, OCCASIONAL (ICD-785.1) NIPPLE DISCHARGE (ICD-611.79) CANDIDIASIS, VAGINAL (ICD-112.1) FAMILY HISTORY DIABETES 1ST DEGREE RELATIVE (ICD-V18.0)  Allergies (verified): 1)  ! * Seafood   Social History: Single Never Smoked Alcohol use-yes-rare Drug use-no Regular exercise-yes Occupation: [employer]   Review of Systems       wt up since november, no lymphadenopathy , no f/c, was treated for bronchitis in dec/jan and had no change in her vaginitis signs.   Vital Signs:  Patient profile:   25 year old female Height:      65.5 inches (166.37 cm) Weight:      205.7 pounds (93.50  kg) BMI:     33.83 Temp:     97.8 degrees F (36.56 degrees C) oral Pulse rate:   102 / minute BP sitting:   140 / 80  (left arm)  Vitals Entered By: Baxter Hire) (November 25, 2009 2:58 PM) CC: new patient - recurring yeast inf. Is Patient Diabetic? No Pain Assessment Patient in pain? no      Nutritional Status BMI of > 30 = obese Nutritional Status Detail appetite is normal per patient  Does patient need assistance? Functional Status Self care Ambulation Normal   Physical Exam  General:  well-developed, well-nourished, well-hydrated, and overweight-appearing.   Eyes:  pupils equal, pupils round, and pupils reactive to light.   Mouth:  pharynx pink and moist and no exudates.   Neck:  no masses.   Lungs:  normal respiratory effort and normal breath sounds.   Heart:  normal rate, regular rhythm, and no murmur.   Abdomen:  soft, non-tender, and normal bowel sounds.   Extremities:  no edema   Impression & Recommendations:  Problem # 1:  CANDIDIASIS, VAGINAL (ICD-112.1)  Leading DDX includes HIV, hypothyroid and DM.  has been prev tested for syphillis, gc/chlamydia, HIV (all negative 1-10). Had normal TSH 3-10.  will repeat these test and have her back in 2-3 weeks. She states she has not had any new sex partners in last  ~6 months.   Orders: Consultation Level IV (16109) T-CBC w/Diff [id number]) T-Basic Metabolic Panel [id number]) T-HIV Viral Load [id number]) T-Hgb A1C (in-house) (96295MW) T-TSH [id number])  Medications Added to Medication  List This Visit: 1)  Doxycycline Hyclate 100 Mg Solr (Doxycycline hyclate) .... Take 1 tablet by mouth once a day 2)  Phentermine Hcl Powd (Phentermine hcl) Process Orders Check Orders Results:     [facility]: ABN not required for this insurance Tests Sent for requisitioning (November 25, 2009 3:54 PM):     11/25/2009: [facility] -- T-CBC w/Diff [[id number]] (signed)      11/25/2009: [facility] -- T-Basic Metabolic Panel [id number] (signed)     11/25/2009: [facility] -- T-HIV Viral Load [phone] (signed)     11/25/2009: [facility] -- T-TSH [id number] (signed)   Appended Document: Results of HGB A1C    Lab Visit  Laboratory Results   Blood Tests   Date/Time Received: November 25, 2009 4:13 PM  Date/Time Reported: Burke Keels  November 25, 2009 4:13 PM   HGBA1C: 5.9%   (Normal Range: Non-Diabetic - 3-6%   Control Diabetic - 6-8%)    Orders Today:

## 2010-11-18 NOTE — Miscellaneous (Signed)
Summary: HIPAA Restrictions  HIPAA Restrictions   Imported By: Florinda Marker 11/25/2009 16:58:47  _____________________________________________________________________  External Attachment:    Type:   Image     Comment:   External Document

## 2010-11-18 NOTE — Assessment & Plan Note (Signed)
Summary: 2WK F/U/VS   Primary Provider:  Laury Axon  CC:  2 week follow up.  History of Present Illness: 25 yo F with recurrent bacterial and yeast infections for the last 7 years. Has been increasing in freq over the last 5 yrs. Gets after sex, sometimes spontaneously. has tried changing soap, changing cotton underwear, wiping pattern. Using boric acid therapy now. has had since prior to starting on doxy for skin.  Has not noted any difference since her last visit.   Preventive Screening-Counseling & Management  Alcohol-Tobacco     Alcohol drinks/day: <1     Alcohol type: mixed drinks     Smoking Status: never  Caffeine-Diet-Exercise     Caffeine use/day: 0     Does Patient Exercise: yes     Type of exercise: gym, aerobics     Exercise (avg: min/session): 30-60     Times/week: <3  Safety-Violence-Falls     Seat Belt Use: yes   Current Allergies (reviewed today): ! * SEAFOOD Vital Signs:  Patient profile:   25 year old female Height:      65.5 inches (166.37 cm) Weight:      206.5 pounds (93.86 kg) BMI:     33.96 Temp:     98.6 degrees F (37.00 degrees C) oral Pulse rate:   97 / minute BP sitting:   130 / 85  (left arm)  Vitals Entered By: Baxter Hire) (December 09, 2009 3:57 PM) CC: 2 week follow up Pain Assessment Patient in pain? no      Nutritional Status BMI of > 30 = obese Nutritional Status Detail appetite is normal per patient  Does patient need assistance? Functional Status Self care Ambulation Normal   Physical Exam  General:  well-developed, well-nourished, well-hydrated, and overweight-appearing.     Impression & Recommendations:  Problem # 1:  CANDIDIASIS, VAGINAL (ICD-112.1)  she will call/come back for appt when she has finished the boric acid rx. Would like to get her to come off the doxy as well. if she has recurrance then will send her vaginal d/c for cx and id of fungus, sensitivity.    Orders: Est. Patient Level III  (16109)  Problem # 2:  OVERWEIGHT (ICD-278.02)  spoke at length with her about wt mgmt. encouraged her to diet and exercise.  she is starting adkins. she would like to consider banding ( would need BMI > 40). 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  she is seeing a nutritionist.   Orders: Est. Patient Level III (60454)

## 2011-03-03 NOTE — H&P (Signed)
Lindsey Ray, Lindsey Ray              ACCOUNT NO.:  [id number]   MEDICAL RECORD NO.:  [id number]          PATIENT TYPE:  AMB   LOCATION:  [facility]                         FACILITY:  [facility]   PHYSICIAN:  Timothy P. Fontaine, M.D.DATE OF BIRTH:  1986/09/24   DATE OF ADMISSION:  01/30/2008  DATE OF DISCHARGE:                              HISTORY & PHYSICAL   CHIEF COMPLAINT:  High-grade SIL of the cervix.   HISTORY OF PRESENT ILLNESS:  A 25 year old followed for cervical  dysplasia initially had an ASCUS Pap smear with positive high risk HPV  in April 2008.  Biopsy showed moderate dysplasia.  She was followed  expectantly, had a re-exam February 2009 with biopsy showing severe  dysplasia.  Options for management were reviewed to include continued  observation versus therapy.  She elects for therapy.  She does have a  fair ectropion and due to the ectropion with elected for laser ablation  versus trying to do an excisional process such as LEEP for cone.   PAST MEDICAL HISTORY:  Unremarkable.   PAST SURGICAL HISTORY:  None.   ALLERGIES:  No medications.   CURRENT MEDICATIONS:  Oral contraceptives.   REVIEW OF SYSTEMS:  Noncontributory.   SOCIAL HISTORY:  Noncontributory.   FAMILY HISTORY:  Noncontributory.   ADMISSION PHYSICAL EXAM:  Afebrile vital signs stable.  HEENT: Normal.  LUNGS:  Clear.  CARDIAC:  Regular rate.  No rubs, murmurs or gallops.  ABDOMINAL EXAM:  Benign.  PELVIC:  External BUS, vagina normal.  Cervix normal grossly.  Uterus  normal size, midline mobile, nontender.  Adnexa without masses or  tenderness.   ASSESSMENT:  A 25 year old, severe dysplasia, cervical biopsies for  laser ablation.  I reviewed with the patient the proposed surgery.  The  expected intraoperative, postoperative courses, the short-term long-term  issues with laser ablation.  Various modes of therapy were reviewed.  Given the degree of her ectropion I felt for excisional process such as  LEEP  or cone that this would involve removal of a significant portion of  cervix and I think from a conservative standpoint laser would be better  for this.  I reviewed the risks with her to include bleeding,  transfusion, infection, damage to surrounding tissues such as bladder,  vagina, rectum leading to major reparative surgeries to include  exploratory laparotomy type repairs.  I also reviewed the longer-term  issues such as delayed hemorrhage as  well as the fertility issue with cervical laser to include the  possibilities of difficulties achieving pregnancy in the future or  maintaining pregnancy such as incompetent cervix.  The patient  understands these risks and accepts them.  The patient's questions were  answered to her satisfaction.  She is ready to proceed with surgery.      Timothy P. Fontaine, M.D.  Electronically Signed     TPF/MEDQ  D:  01/26/2008  T:  01/26/2008  Job:  [id number]

## 2011-03-03 NOTE — Op Note (Signed)
Lindsey Ray, Lindsey Ray              ACCOUNT NO.:  [id number]   MEDICAL RECORD NO.:  [id number]          PATIENT TYPE:  AMB   LOCATION:  [facility]                         [facility]AC[facility]   PHYSICIAN:  Timothy P. 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   PROCEDURE:  Carbon dioxide laser vaporization of cervical  intraepithelial neoplasia grade 3.   SURGEON:  Fontaine.   ANESTHETIC:  General with 1% lidocaine paracervical block.   ESTIMATED BLOOD LOSS:  Minimal.   COMPLICATIONS:  None.   SPECIMEN:  None.   FINDINGS:  EUA external:  BUS, vagina grossly normal.  Cervix grossly  normal.  Bimanual:  Uterus normal size, midline and mobile.  Adnexa  without masses.  Colposcopic evaluation:  Acetic acid cleanse was  adequate with a large ectropion with acetowhite change at 12 o'clock and  2 o'clock noted.   PROCEDURE:  The patient was taken to the operating room, underwent  general anesthesia and was placed in the low dorsal lithotomy position.  EUA was performed.  Subsequently the cervix was visualized with a laser  coated speculum.  Moist lap towels were then draped around the perineum.  The cervix was visualized with the colposcope, cleansed with acetic  acid, and colposcopy was performed with findings noted above.  Using the  CO2 laser 30 watts continuous power, the laser was initially test fired  to assure alignment of the targeting laser.  Subsequently the  transformation zone was outlined with the laser, and subsequent laser  ablation of the cervix was performed in quadrants to a depth of 7 mm.  At the end of the procedure, a paracervical block using 1% lidocaine was  placed, a total of 10 mL, and then  prophylactic Monsel's was applied  although no active bleeding was noted.  The patient was then placed in  the supine position, awakened without difficulty having tolerated  procedure well.  She was taken to the recovery room in good condition.      Timothy P. Fontaine, M.D.  Electronically Signed     TPF/MEDQ  D:  01/30/2008  T:  01/30/2008  Job:  [id number]

## 2021-04-18 ENCOUNTER — Other Ambulatory Visit: Payer: Self-pay

## 2021-04-18 ENCOUNTER — Encounter (HOSPITAL_COMMUNITY): Payer: Self-pay | Admitting: Emergency Medicine

## 2021-04-18 ENCOUNTER — Ambulatory Visit (HOSPITAL_COMMUNITY)
Admission: EM | Admit: 2021-04-18 | Discharge: 2021-04-18 | Disposition: A | Discharge status: Home / Self Care | Payer: Self-pay

## 2021-04-18 DIAGNOSIS — N390 Urinary tract infection, site not specified: Secondary | ICD-10-CM

## 2021-04-18 LAB — POCT URINALYSIS DIPSTICK, ED / UC
Bilirubin Urine: NEGATIVE
Glucose, UA: NEGATIVE mg/dL
Ketones, ur: NEGATIVE mg/dL
Nitrite: NEGATIVE
Protein, ur: NEGATIVE mg/dL
Specific Gravity, Urine: 1.02 (ref 1.005–1.030)
Urobilinogen, UA: 0.2 mg/dL (ref 0.0–1.0)
pH: 6 (ref 5.0–8.0)

## 2021-04-18 MED ORDER — SULFAMETHOXAZOLE-TRIMETHOPRIM 800-160 MG PO TABS: 1.0000 | ORAL_TABLET | Freq: Two times a day (BID) | ORAL | 0 refills | Status: AC

## 2021-04-18 NOTE — ED Triage Notes (Signed)
PT reports urinary frequency and discomfort, started today.

## 2021-04-18 NOTE — ED Provider Notes (Signed)
[facility]    CSN: [id number] Arrival date & time: 04/18/21  1202      History   Chief Complaint Chief Complaint  Patient presents with   Urinary Tract Infection    HPI Lindsey Ray is a 35 y.o. female.   Patient presenting today with 1 day history of urinary frequency, dysuria, bloating.  Denies fever, chills, abdominal pain, nausea vomiting or diarrhea, flank pain, vaginal symptoms.  So far not trying anything over-the-counter for symptoms.  Denies concern for STD, currently on her menstrual cycle with no concerns for pregnancy.   Past Medical History:  Diagnosis Date   Cervical dysplasia     Patient Active Problem List   Diagnosis Date Noted   BLURRED VISION 12/12/2009   OTHER ACUTE REACTIONS TO STRESS 12/21/2008   FATIGUE 12/21/2008   OVERWEIGHT 12/18/2008   LIVER FUNCTION TESTS, ABNORMAL, HX OF 11/19/2008   PAP SMEAR, ABNORMAL 11/16/2007   NIPPLE DISCHARGE 05/26/2007   PALPITATIONS, OCCASIONAL 05/26/2007   CANDIDIASIS, VAGINAL 03/31/2007    Past Surgical History:  Procedure Laterality Date   LASER ABLATION OF THE CERVIX  01/2008   CERVICAL DYSPLASIA    OB History   No obstetric history on file.      Home Medications    Prior to Admission medications   Medication Sig Start Date End Date Taking? Authorizing Provider  ferrous sulfate 325 (65 FE) MG tablet Take 325 mg by mouth daily with breakfast.   Yes [provider]  sulfamethoxazole-trimethoprim (BACTRIM DS) 800-160 MG tablet Take 1 tablet by mouth 2 (two) times daily. 04/18/21  Yes Particia Nearing, PA-C  Adapalene-Benzoyl Peroxide (EPIDUO) 0.1-2.5 % gel Apply topically at bedtime.      [provider]  aspirin 81 MG tablet Take 81 mg by mouth daily.      [provider]  diphenhydrAMINE (BENADRYL) 50 MG capsule Take 50 mg by mouth every 6 (six) hours as needed.      [provider]  doxycycline (VIBRAMYCIN) 100 MG capsule Take 100 mg by mouth as  needed.      [provider]  ibuprofen (ADVIL,MOTRIN) 100 MG tablet Take 100 mg by mouth every 6 (six) hours as needed.      [provider]  levonorgestrel-ethinyl estradiol (AVIANE,ALESSE,LESSINA) 0.1-20 MG-MCG per tablet Take 1 tablet by mouth daily.      [provider]  Misc Natural Products (FIBER 7 PO) Take by mouth.      [provider]    Family History No family history on file.  Social History Social History   Tobacco Use   Smoking status: Unknown  Substance Use Topics   Alcohol use: Yes     Allergies   Patient has no known allergies.   Review of Systems Review of Systems Per HPI  Physical Exam Triage Vital Signs ED Triage Vitals  Enc Vitals Group     BP 04/18/21 1255 120/68     Pulse Rate 04/18/21 1255 80     Resp 04/18/21 1255 16     Temp 04/18/21 1255 99.3 F (37.4 C)     Temp Source 04/18/21 1255 Oral     SpO2 04/18/21 1255 100 %     Weight --      Height --      Head Circumference --      Peak Flow --      Pain Score 04/18/21 1252 3     Pain Loc --  Pain Edu? --      Excl. in GC? --    No data found.  Updated Vital Signs BP 120/68   Pulse 80   Temp 99.3 F (37.4 C) (Oral)   Resp 16   LMP 04/12/2021   SpO2 100%   Visual Acuity Right Eye Distance:   Left Eye Distance:   Bilateral Distance:    Right Eye Near:   Left Eye Near:    Bilateral Near:     Physical Exam Vitals and nursing note reviewed.  Constitutional:      Appearance: Normal appearance. She is not ill-appearing.  HENT:     Head: Atraumatic.     Mouth/Throat:     Mouth: Mucous membranes are moist.  Eyes:     Extraocular Movements: Extraocular movements intact.     Conjunctiva/sclera: Conjunctivae normal.  Cardiovascular:     Rate and Rhythm: Normal rate and regular rhythm.     Heart sounds: Normal heart sounds.  Pulmonary:     Effort: Pulmonary effort is normal.     Breath sounds: Normal breath sounds.  Abdominal:      General: Bowel sounds are normal. There is no distension.     Palpations: Abdomen is soft.     Tenderness: There is no abdominal tenderness. There is no right CVA tenderness, left CVA tenderness or guarding.  Genitourinary:    Comments: GU exam deferred Musculoskeletal:        General: Normal range of motion.     Cervical back: Normal range of motion and neck supple.  Skin:    General: Skin is warm and dry.  Neurological:     Mental Status: She is alert and oriented to person, place, and time.  Psychiatric:        Mood and Affect: Mood normal.        Thought Content: Thought content normal.        Judgment: Judgment normal.     UC Treatments / Results  Labs (all labs ordered are listed, but only abnormal results are displayed) Labs Reviewed  POCT URINALYSIS DIPSTICK, ED / UC - Abnormal; Notable for the following components:      Result Value   Hgb urine dipstick MODERATE (*)    Leukocytes,Ua SMALL (*)    All other components within normal limits  URINE CULTURE    EKG   Radiology No results found.  Procedures Procedures (including critical care time)  Medications Ordered in UC Medications - No data to display  Initial Impression / Assessment and Plan / UC Course  I have reviewed the triage vital signs and the nursing notes.  Pertinent labs & imaging results that were available during my care of the patient were reviewed by me and considered in my medical decision making (see chart for details).     UA with leukocytes and hemoglobin, urine culture pending.  She states [place] is going [place] tomorrow so we will prescribe Bactrim while awaiting urine culture.  We will call if any changes are needed based on urine culture results.  Push fluids, probiotics.  Return for acutely worsening symptoms.  Final Clinical Impressions(s) / UC Diagnoses   Final diagnoses:  Acute lower UTI   Discharge Instructions   None    ED Prescriptions      Medication Sig Dispense Auth. Provider   sulfamethoxazole-trimethoprim (BACTRIM DS) 800-160 MG tablet Take 1 tablet by mouth 2 (two) times daily. 6 tablet Roosvelt Maser  Lanora Manis, PA-C      PDMP not reviewed this encounter.   Particia Nearing, [place] 04/18/21 1347
